# Patient Record
Sex: Female | Born: 2000 | Race: Black or African American | Hispanic: No | Marital: Single | Smoking: Never smoker
Health system: Southern US, Community
[De-identification: ages and names within clinical notes are randomized; demographics above are authoritative.]

---
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
## 2018-06-11 ENCOUNTER — Emergency Department (HOSPITAL_COMMUNITY)
Admission: EM | Admit: 2018-06-11 | Discharge: 2018-06-11 | Disposition: A | Discharge status: Home / Self Care | Payer: Medicaid Other | Attending: Emergency Medicine | Admitting: Emergency Medicine

## 2018-06-11 ENCOUNTER — Encounter (HOSPITAL_COMMUNITY): Payer: Self-pay | Admitting: Emergency Medicine

## 2018-06-11 ENCOUNTER — Emergency Department (HOSPITAL_COMMUNITY): Payer: Medicaid Other

## 2018-06-11 DIAGNOSIS — R1011 Right upper quadrant pain: Secondary | ICD-10-CM | POA: Diagnosis not present

## 2018-06-11 DIAGNOSIS — R1013 Epigastric pain: Secondary | ICD-10-CM

## 2018-06-11 DIAGNOSIS — R109 Unspecified abdominal pain: Secondary | ICD-10-CM | POA: Diagnosis present

## 2018-06-11 LAB — URINALYSIS, ROUTINE W REFLEX MICROSCOPIC
Bilirubin Urine: NEGATIVE
Glucose, UA: NEGATIVE mg/dL
Ketones, ur: NEGATIVE mg/dL
Nitrite: NEGATIVE
Protein, ur: NEGATIVE mg/dL
Specific Gravity, Urine: <1.005 — ABNORMAL LOW (ref 1.005–1.030)
pH: 5.5 (ref 5.0–8.0)

## 2018-06-11 LAB — COMPREHENSIVE METABOLIC PANEL
ALT: 13 U/L (ref 0–44)
AST: 18 U/L (ref 15–41)
Albumin: 4 g/dL (ref 3.5–5.0)
Alkaline Phosphatase: 36 U/L — ABNORMAL LOW (ref 38–126)
Anion gap: 10 (ref 5–15)
BUN: <5 mg/dL — ABNORMAL LOW (ref 6–20)
CO2: 24 mmol/L (ref 22–32)
Calcium: 10 mg/dL (ref 8.9–10.3)
Chloride: 104 mmol/L (ref 98–111)
Creatinine, Ser: 0.86 mg/dL (ref 0.44–1.00)
GFR calc Af Amer: >60 mL/min (ref >60)
GFR calc non Af Amer: >60 mL/min (ref >60)
Glucose, Bld: 100 mg/dL — ABNORMAL HIGH (ref 70–99)
Potassium: 3.8 mmol/L (ref 3.5–5.1)
Sodium: 138 mmol/L (ref 135–145)
Total Bilirubin: 0.5 mg/dL (ref 0.3–1.2)
Total Protein: 6.7 g/dL (ref 6.5–8.1)

## 2018-06-11 LAB — I-STAT BETA HCG BLOOD, ED (MC, WL, AP ONLY): I-stat hCG, quantitative: <5 m[IU]/mL (ref <5)

## 2018-06-11 LAB — URINALYSIS, MICROSCOPIC (REFLEX): RBC / HPF: >50 RBC/hpf (ref 0–5)

## 2018-06-11 LAB — CBC
HCT: 39.1 % (ref 36.0–46.0)
Hemoglobin: 12.1 g/dL (ref 12.0–15.0)
MCH: 26.5 pg (ref 26.0–34.0)
MCHC: 30.9 g/dL (ref 30.0–36.0)
MCV: 85.6 fL (ref 78.0–100.0)
Platelets: 288 10*3/uL (ref 150–400)
RBC: 4.57 MIL/uL (ref 3.87–5.11)
RDW: 12.9 % (ref 11.5–15.5)
WBC: 4.3 10*3/uL (ref 4.0–10.5)

## 2018-06-11 LAB — LIPASE, BLOOD: Lipase: 31 U/L (ref 11–51)

## 2018-06-11 MED ORDER — SUCRALFATE 1 GM/10ML PO SUSP: 1.0000 g | Freq: Three times a day (TID) | ORAL | 0 refills | Status: AC

## 2018-06-11 MED ORDER — ONDANSETRON HCL 4 MG PO TABS: 4.0000 mg | ORAL_TABLET | Freq: Four times a day (QID) | ORAL | 0 refills | Status: AC

## 2018-06-11 MED ORDER — FAMOTIDINE 40 MG PO TABS: 40.0000 mg | ORAL_TABLET | Freq: Every day | ORAL | 0 refills | Status: AC

## 2018-06-11 NOTE — Discharge Instructions (Addendum)
It is possible you have gastritis or ulcer.  Please follow-up with the gastroenterologist for further evaluation of this.  Avoid NSAID medication.  Take Pepcid once daily at bedtime.  Take Zofran every 6 hours as needed for nausea or vomiting.  Take Carafate before eating and before bed to help coat your stomach lining.  Please return the emergency department if you develop any new or worsening symptoms including severe, worsening abdominal pain, intractable vomiting, feeling like you are going to pass out, bloody stools, or any other concerning symptom.

## 2018-06-11 NOTE — ED Notes (Signed)
Pt ambulated to the restroom.
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
## 2018-06-11 NOTE — ED Triage Notes (Signed)
Patient to ED c/o abdominal pain x 3 days, states it feels like she is hungry and getting punched in the stomach at the same time. Endorsing nausea, no vomiting or diarrhea, no fevers/chills. LMP today.

## 2018-06-12 ENCOUNTER — Emergency Department (HOSPITAL_COMMUNITY): Payer: Medicaid Other

## 2018-06-12 ENCOUNTER — Emergency Department (HOSPITAL_COMMUNITY)
Admission: EM | Admit: 2018-06-12 | Discharge: 2018-06-12 | Disposition: A | Discharge status: Home / Self Care | Payer: Medicaid Other | Attending: Emergency Medicine | Admitting: Emergency Medicine

## 2018-06-12 ENCOUNTER — Encounter (HOSPITAL_COMMUNITY): Payer: Self-pay | Admitting: Emergency Medicine

## 2018-06-12 ENCOUNTER — Other Ambulatory Visit: Payer: Self-pay

## 2018-06-12 DIAGNOSIS — M542 Cervicalgia: Secondary | ICD-10-CM | POA: Insufficient documentation

## 2018-06-12 DIAGNOSIS — M79602 Pain in left arm: Secondary | ICD-10-CM | POA: Diagnosis not present

## 2018-06-12 LAB — I-STAT TROPONIN, ED: TROPONIN I, POC: 0.01 ng/mL (ref 0.00–0.08)

## 2018-06-12 MED ORDER — NAPROXEN 250 MG PO TABS
500.0000 mg | ORAL_TABLET | Freq: Once | ORAL | Status: AC
  Administered 2018-06-12: 500 mg via ORAL
  Filled 2018-06-12: qty 2

## 2018-06-12 NOTE — ED Triage Notes (Signed)
Patient woke up this morning with left chest pain radiating to left arm with nausea , denies emesis or diaphoresis , no cough or fever . Mother gave her ASA 81 mg prior to arrival .

## 2018-06-12 NOTE — Discharge Instructions (Addendum)
Take the medications that you were prescribed during her last visit. Return to the ER if the symptoms return and are not resolving or the pain is excruciating.

## 2018-06-12 NOTE — ED Notes (Signed)
ED Provider at bedside. 

## 2018-06-13 NOTE — ED Provider Notes (Signed)
[facility] EMERGENCY DEPARTMENT Provider Note   CSN: [id number] Arrival date & time: 06/12/18  0406     History   Chief Complaint Chief Complaint  Patient presents with  . Chest Pain    HPI Christy Mullins is a 17 y.o. female.  HPI 17 year old female comes in with chief complaint of chest pain.  Patient reports that she woke up in the middle night all of a sudden with left-sided chest pain radiating down her left arm.  Patient had some nausea however she denies any vomiting or shortness of breath or diaphoresis.  Patient symptoms started 3:00 and her symptoms are constant.  Patient took aspirin prior to ED arrival and now she feels better, but still thinks the pain is not completely resolved.  Patient also feels like she has pain in the lower part of her neck.  She does not think she slept funny denies any history of trauma.  Pt has no hx of PE, DVT and denies any exogenous hormone (testosterone / estrogen) use, long distance travels or surgery in the past 6 weeks, active cancer, recent immobilization.  No family history of premature CAD.  History reviewed. No pertinent past medical history.  Patient Active Problem List   Diagnosis Date Noted  . Iron deficiency anemia due to chronic blood loss 04/01/2017  . Heavy menses 04/01/2017    History reviewed. No pertinent surgical history.   OB History   None      Home Medications    Prior to Admission medications   Medication Sig Start Date End Date Taking? Authorizing Provider  famotidine (PEPCID) 40 MG tablet Take 1 tablet (40 mg total) by mouth at bedtime. 06/11/18  Yes Law, Waylan Boga, PA-C  sucralfate (CARAFATE) 1 GM/10ML suspension Take 10 mLs (1 g total) by mouth 4 (four) times daily -  with meals and at bedtime. 06/11/18  Yes Law, Waylan Boga, PA-C  naproxen (NAPROSYN) 250 MG tablet Take 1 tablet (250 mg total) by mouth 2 (two) times daily as needed. Patient not taking: Reported on 06/12/2018 01/13/17    Ardith Dark, MD  ondansetron (ZOFRAN) 4 MG tablet Take 1 tablet (4 mg total) by mouth every 6 (six) hours. 06/11/18   Emi Holes, PA-C    Family History No family history on file.  Social History Social History   Tobacco Use  . Smoking status: Never Smoker  . Smokeless tobacco: Never Used  Substance Use Topics  . Alcohol use: Never    Frequency: Never  . Drug use: Never     Allergies   Patient has no known allergies.   Review of Systems Review of Systems  All other systems reviewed and are negative.    Physical Exam Updated Vital Signs BP (!) 118/53   Pulse (!) 57   Temp 98.4 F (36.9 C) (Oral)   Resp 18   Ht 5\' 5"  (1.651 m)   Wt 50 kg   LMP 06/11/2018 (Exact Date)   SpO2 100%   BMI 18.34 kg/m   Physical Exam  Constitutional: She is oriented to person, place, and time. She appears well-developed.  HENT:  Head: Normocephalic and atraumatic.  Eyes: EOM are normal.  Neck: Normal range of motion. Neck supple.  Midline cspine tenderness over the c7  Cardiovascular: Normal rate and normal pulses.  Pulmonary/Chest: Effort normal.  Abdominal: Bowel sounds are normal.  Neurological: She is alert and oriented to person, place, and time.  Skin: Skin is warm and  dry.  Nursing note and vitals reviewed.    ED Treatments / Results  Labs (all labs ordered are listed, but only abnormal results are displayed) Labs Reviewed  I-STAT TROPONIN, ED    EKG EKG Interpretation  Date/Time:  Thursday June 12 2018 04:14:46 EDT Ventricular Rate:  66 PR Interval:    QRS Duration: 88 QT Interval:  367 QTC Calculation: 385 R Axis:   104 Text Interpretation:  Sinus arrhythmia Borderline short PR interval Borderline right axis deviation No acute changes No significant change since last tracing Confirmed by Derwood Kaplan [phone]) on 06/12/2018 4:31:17 AM   Radiology Dg Cervical Spine 2-3 Views  Result Date: 06/12/2018 CLINICAL DATA:  Initial evaluation for  posterior neck pain at C7 level with numbness into left arm EXAM: CERVICAL SPINE - 2-3 VIEW COMPARISON:  None. FINDINGS: There is no evidence of cervical spine fracture or prevertebral soft tissue swelling. Alignment is normal. No other significant bone abnormalities are identified. IMPRESSION: Negative cervical spine radiographs. No radiographic evidence for acute abnormality within the cervical spine. Electronically Signed   By: Rise Mu M.D.   On: 06/12/2018 06:02   US Abdomen Limited Ruq  Result Date: 06/11/2018 CLINICAL DATA:  Right upper quadrant pain for the past 3 days EXAM: ULTRASOUND ABDOMEN LIMITED RIGHT UPPER QUADRANT COMPARISON:  None. FINDINGS: Gallbladder: No gallstones or wall thickening visualized. No sonographic Murphy sign noted by sonographer. Common bile duct: Diameter: 2.3 mm Liver: No focal lesion identified. Within normal limits in parenchymal echogenicity. Portal vein is patent on color Doppler imaging with normal direction of blood flow towards the liver. IMPRESSION: Normal right upper quadrant abdominal ultrasound examination. Electronically Signed   By: David  [place] M.D.   On: 06/11/2018 12:12    Procedures Procedures (including critical care time)  Medications Ordered in ED Medications  naproxen (NAPROSYN) tablet 500 mg (500 mg Oral Given 06/12/18 2130)     Initial Impression / Assessment and Plan / ED Course  I have reviewed the triage vital signs and the nursing notes.  Pertinent labs & imaging results that were available during my care of the patient were reviewed by me and considered in my medical decision making (see chart for details).  Clinical Course as of Jun 14 535  Thu Jun 12, 2018  [id number] DG Cervical Spine 2-3 Views [JB]    Clinical Course User Index [JB] Nelle Don    17 year old female comes in with chief complaint of sudden onset left-sided chest pain and arm pain.  Pain was radiating down her arm.  She on our exam  is also having some neck pain.  X-ray of the cervical spine does not reveal any DJD. Troponin is negative, EKG is reassuring. Patient is PERC negative.  Stable for discharge.  Final Clinical Impressions(s) / ED Diagnoses   Final diagnoses:  Neck pain  Arm pain, anterior, left    ED Discharge Orders    None       Derwood Kaplan, MD 06/13/18 [phone]

## 2019-07-10 IMAGING — CR DG CERVICAL SPINE 2 OR 3 VIEWS
5 series · 5 of 5 positions shown · non-contrast
Comparison: None.

CLINICAL DATA: Initial evaluation for posterior neck pain at C7
level with numbness into left arm

EXAM:
CERVICAL SPINE - 2-3 VIEW

[c-spine lat]
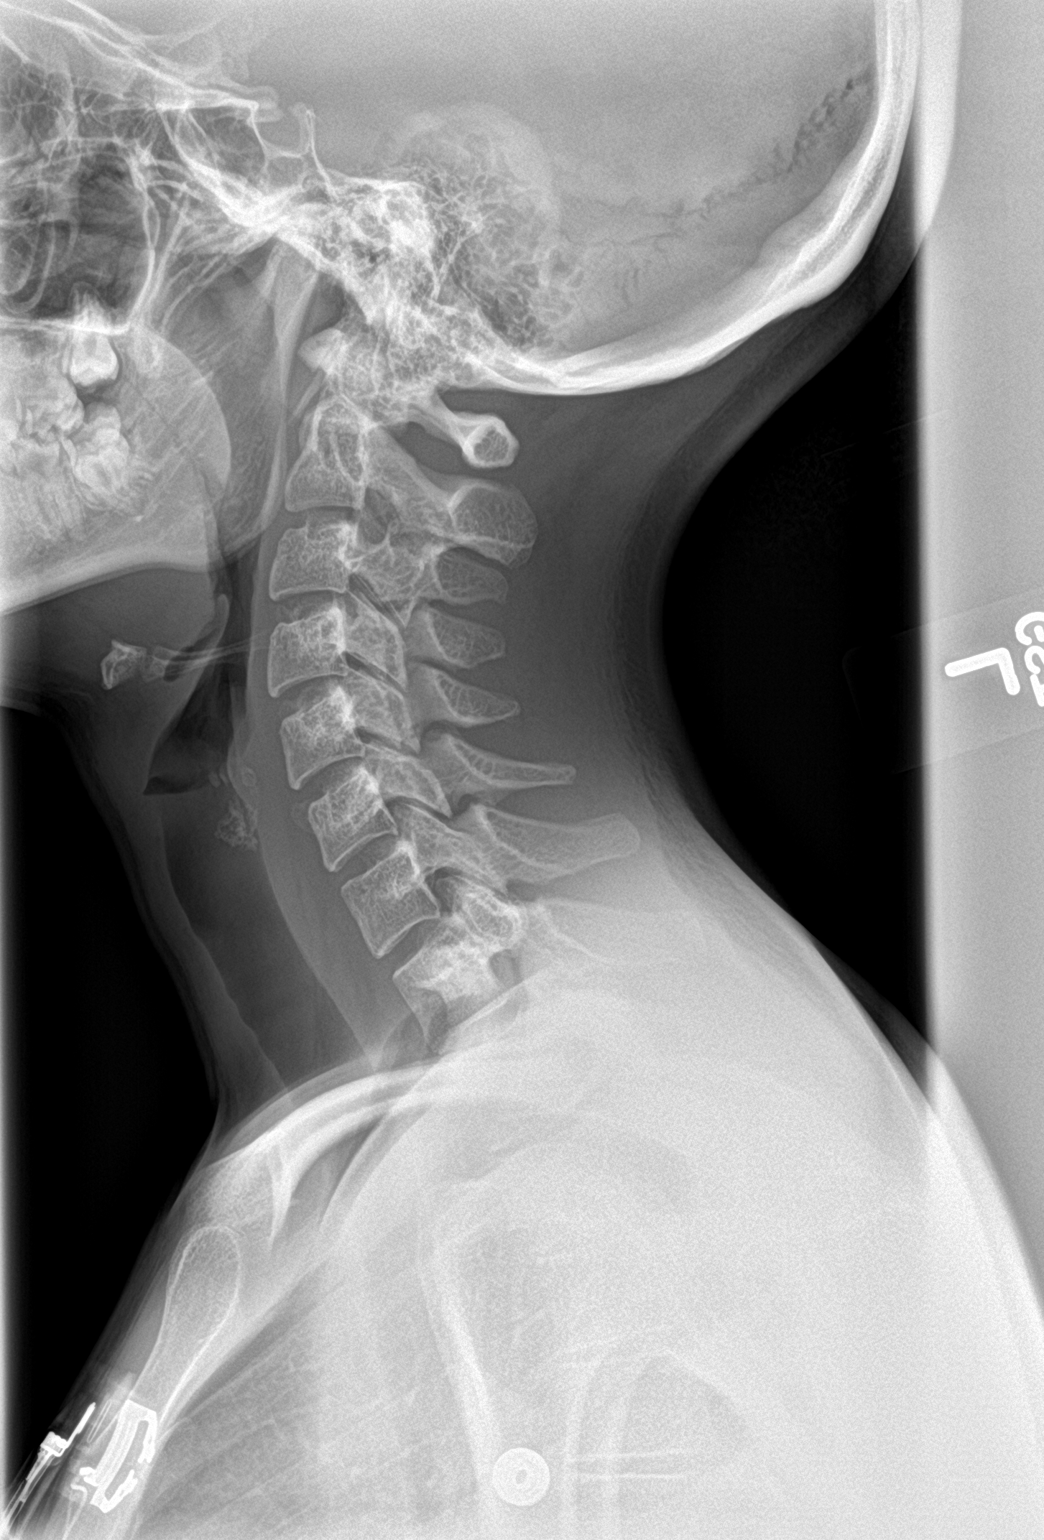

[c-spine ap]
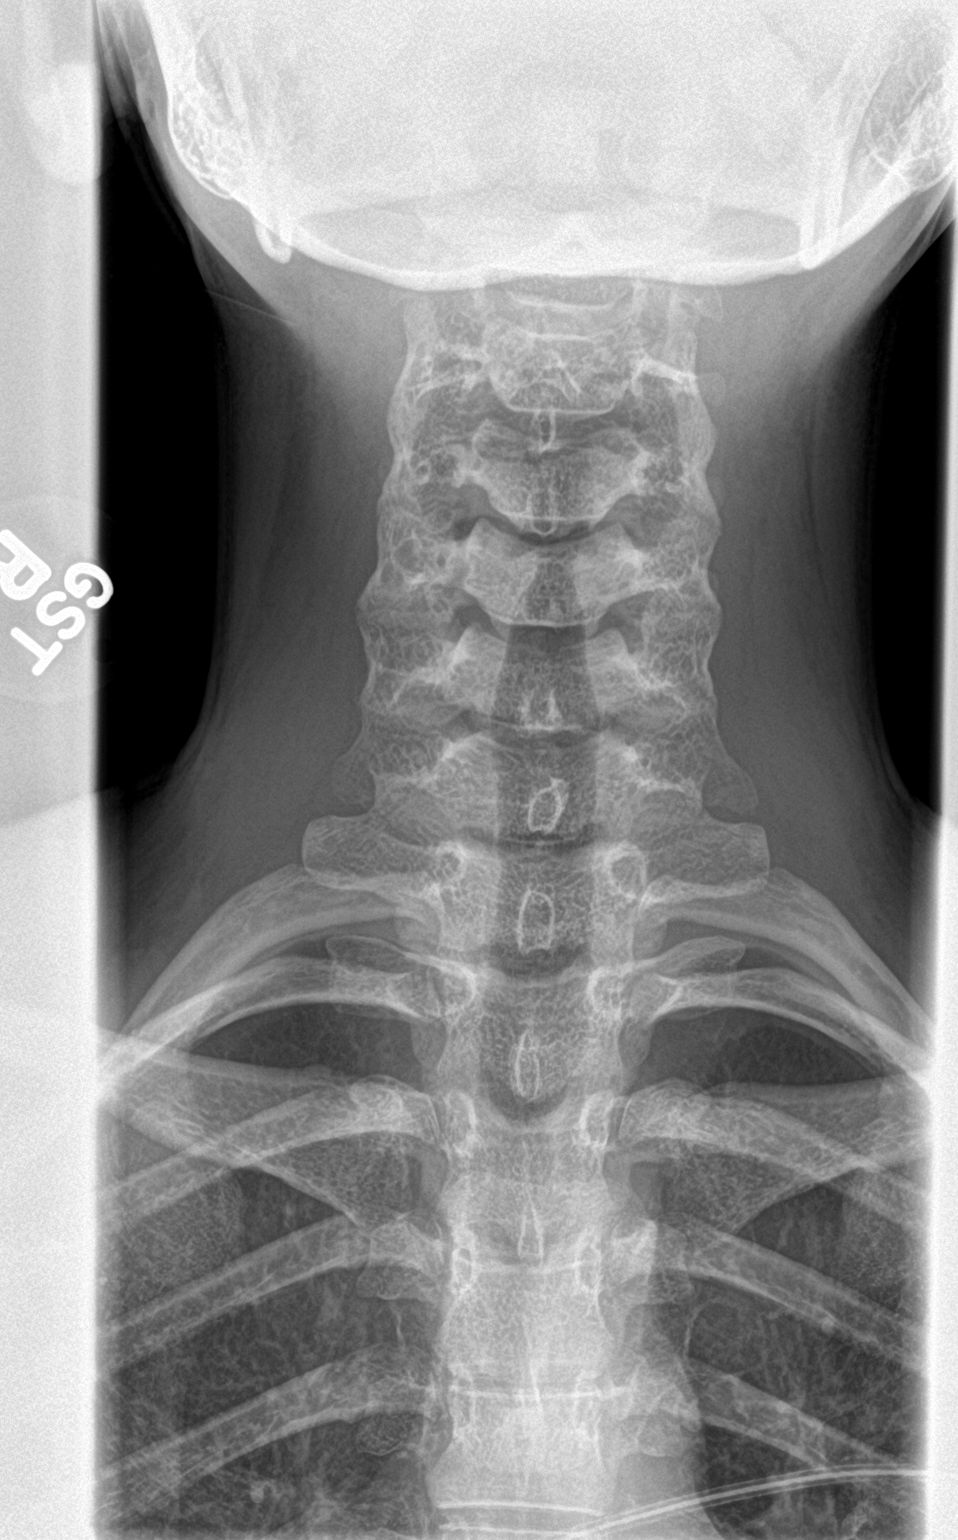

[c-spine open mouth (1 of 3)]
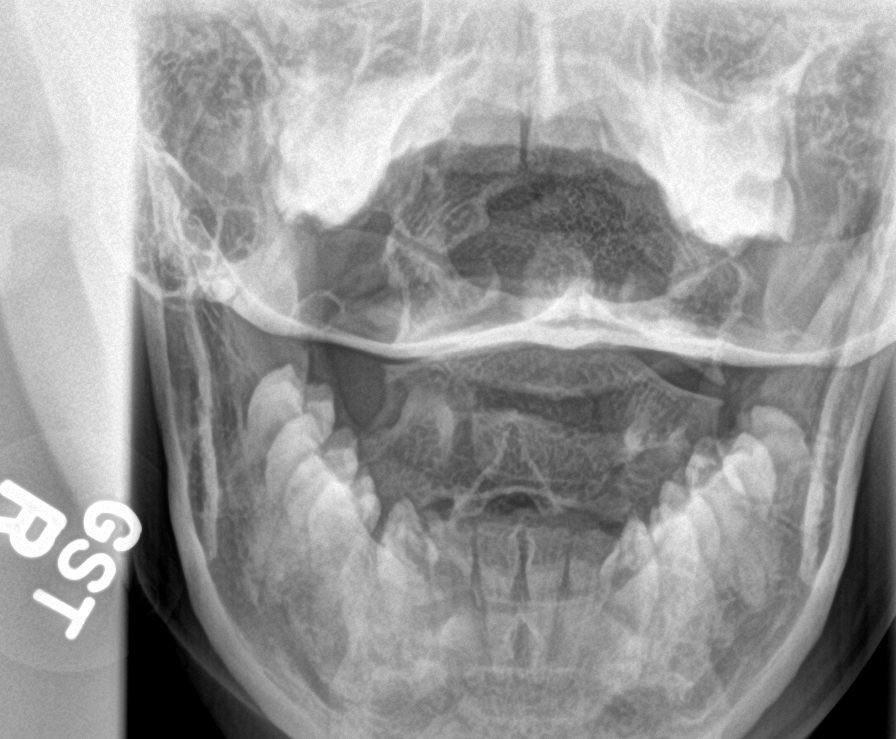

[c-spine open mouth (2 of 3)]
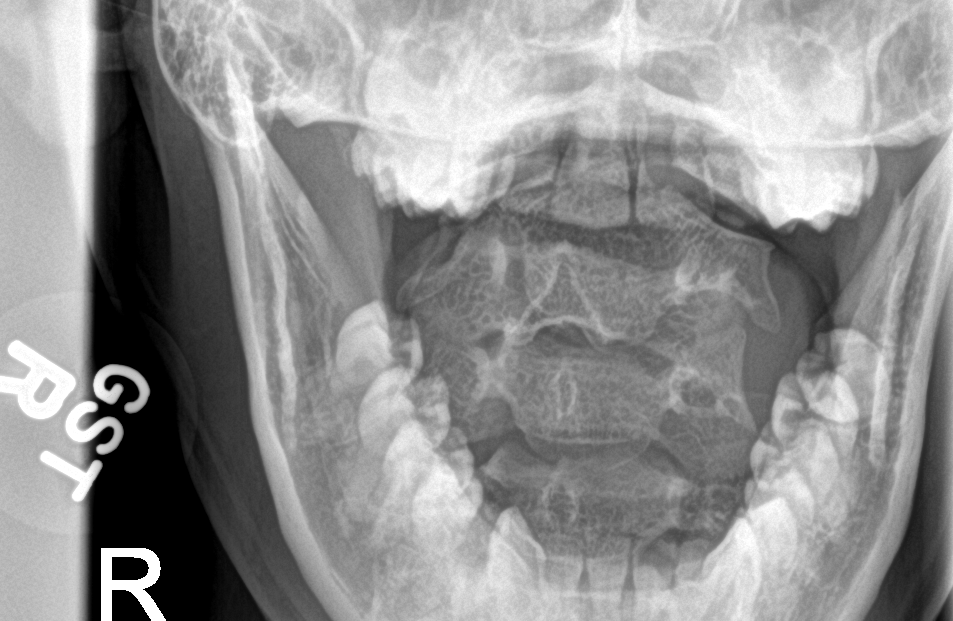

[c-spine open mouth (3 of 3)]
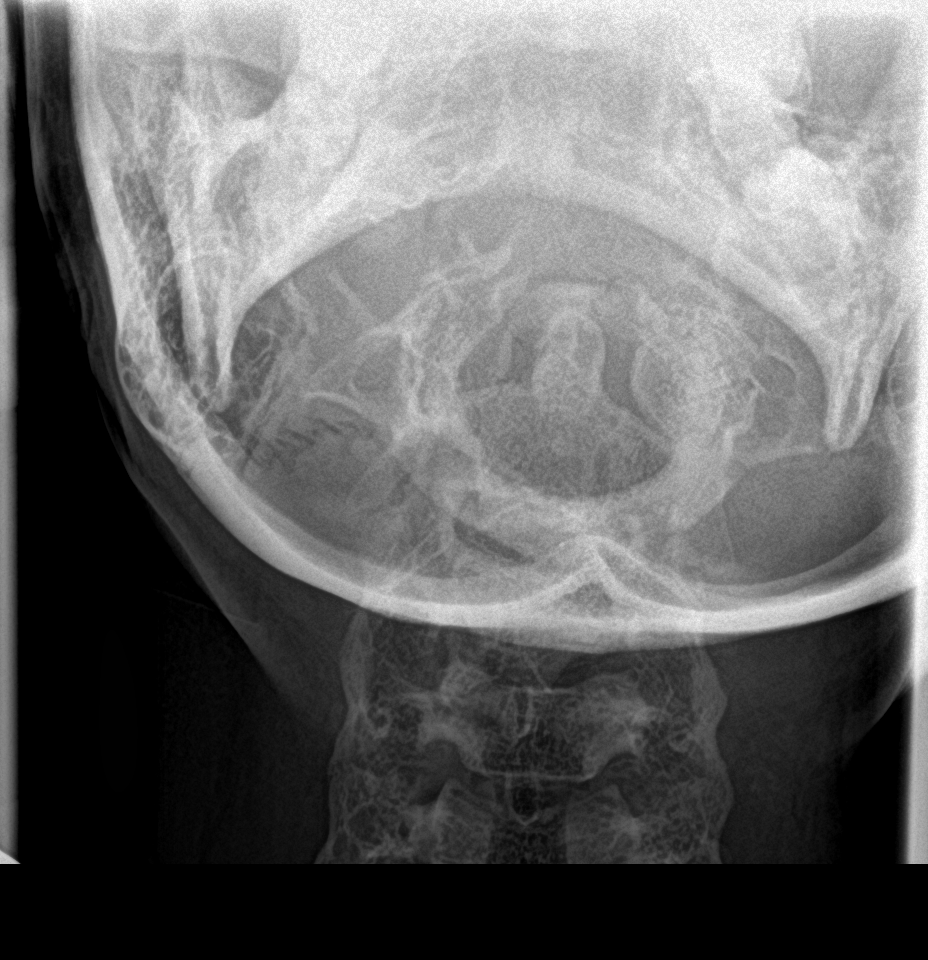

[5 of 5 positions shown; findings below may reference images not displayed]

FINDINGS: There is no evidence of cervical spine fracture or prevertebral soft
tissue swelling. Alignment is normal. No other significant bone
abnormalities are identified.
IMPRESSION: Negative cervical spine radiographs. No radiographic evidence for
acute abnormality within the cervical spine.
